# Patient Record
Sex: Male | Born: 1970 | Race: Black or African American | Hispanic: No | Marital: Single
Health system: Southern US, Community
[De-identification: ages and names within clinical notes are randomized; demographics above are authoritative.]

---

## 2006-02-13 ENCOUNTER — Emergency Department (HOSPITAL_COMMUNITY): Admission: EM | Admit: 2006-02-13 | Discharge: 2006-02-13 | Payer: Self-pay | Admitting: Emergency Medicine

## 2006-08-25 ENCOUNTER — Emergency Department (HOSPITAL_COMMUNITY): Admission: EM | Admit: 2006-08-25 | Discharge: 2006-08-26 | Payer: Self-pay | Admitting: *Deleted

## 2006-10-18 ENCOUNTER — Emergency Department (HOSPITAL_COMMUNITY): Admission: EM | Admit: 2006-10-18 | Discharge: 2006-10-18 | Payer: Self-pay | Admitting: Emergency Medicine

## 2008-11-15 ENCOUNTER — Inpatient Hospital Stay (HOSPITAL_COMMUNITY): Admission: RE | Admit: 2008-11-15 | Discharge: 2008-11-18 | Payer: Self-pay | Admitting: Psychiatry

## 2008-11-15 ENCOUNTER — Ambulatory Visit: Payer: Self-pay | Admitting: Psychiatry

## 2008-11-15 ENCOUNTER — Emergency Department (HOSPITAL_COMMUNITY): Admission: EM | Admit: 2008-11-15 | Discharge: 2008-11-15 | Payer: Self-pay | Admitting: Emergency Medicine

## 2010-04-13 LAB — DIFFERENTIAL
Eosinophils Absolute: 0.3 10*3/uL (ref 0.0–0.7)
Eosinophils Relative: 3 % (ref 0–5)
Lymphocytes Relative: 35 % (ref 12–46)
Lymphs Abs: 3.3 10*3/uL (ref 0.7–4.0)
Monocytes Absolute: 0.5 10*3/uL (ref 0.1–1.0)

## 2010-04-13 LAB — ETHANOL: Alcohol, Ethyl (B): 177 mg/dL — ABNORMAL HIGH (ref 0–10)

## 2010-04-13 LAB — HEPATIC FUNCTION PANEL
Albumin: 3.9 g/dL (ref 3.5–5.2)
Indirect Bilirubin: 0.4 mg/dL (ref 0.3–0.9)
Total Protein: 6.8 g/dL (ref 6.0–8.3)

## 2010-04-13 LAB — COMPREHENSIVE METABOLIC PANEL
ALT: 22 U/L (ref 0–53)
AST: 32 U/L (ref 0–37)
Albumin: 4 g/dL (ref 3.5–5.2)
CO2: 28 mEq/L (ref 19–32)
Chloride: 110 mEq/L (ref 96–112)
Creatinine, Ser: 1.07 mg/dL (ref 0.4–1.5)
GFR calc Af Amer: >60 mL/min (ref >60)
Sodium: 146 mEq/L — ABNORMAL HIGH (ref 135–145)
Total Bilirubin: 0.4 mg/dL (ref 0.3–1.2)

## 2010-04-13 LAB — RAPID URINE DRUG SCREEN, HOSP PERFORMED
Amphetamines: NOT DETECTED
Benzodiazepines: POSITIVE — AB

## 2010-04-13 LAB — CBC
MCV: 92.1 fL (ref 78.0–100.0)
Platelets: 197 10*3/uL (ref 150–400)
RBC: 4.39 MIL/uL (ref 4.22–5.81)
WBC: 9.4 10*3/uL (ref 4.0–10.5)

## 2010-04-13 LAB — TSH: TSH: 0.309 u[IU]/mL — ABNORMAL LOW (ref 0.350–4.500)

## 2010-10-21 LAB — I-STAT 8, (EC8 V) (CONVERTED LAB)
BUN: 7
Bicarbonate: 23.4
HCT: 50
Operator id: 151321
pCO2, Ven: 47.4

## 2010-10-21 LAB — DIFFERENTIAL
Basophils Absolute: 0.1
Lymphocytes Relative: 40
Lymphs Abs: 3.8 — ABNORMAL HIGH
Monocytes Absolute: 0.7
Monocytes Relative: 7
Neutro Abs: 4.7

## 2010-10-21 LAB — ETHANOL: Alcohol, Ethyl (B): 297 — ABNORMAL HIGH

## 2010-10-21 LAB — RAPID URINE DRUG SCREEN, HOSP PERFORMED
Amphetamines: NOT DETECTED
Benzodiazepines: NOT DETECTED
Cocaine: NOT DETECTED
Tetrahydrocannabinol: POSITIVE — AB

## 2010-10-21 LAB — CBC
Hemoglobin: 15.1
RBC: 5.03
WBC: 9.5

## 2010-10-21 LAB — POCT I-STAT CREATININE: Creatinine, Ser: 1.1

## 2010-11-21 IMAGING — CR DG KNEE COMPLETE 4+V*L*
4 series · 4 of 4 positions shown · non-contrast
Comparison: None.

CLINICAL DATA: Anterior knee pain, twisting injury

LEFT KNEE - COMPLETE 4+ VIEW

[t knee ap left]
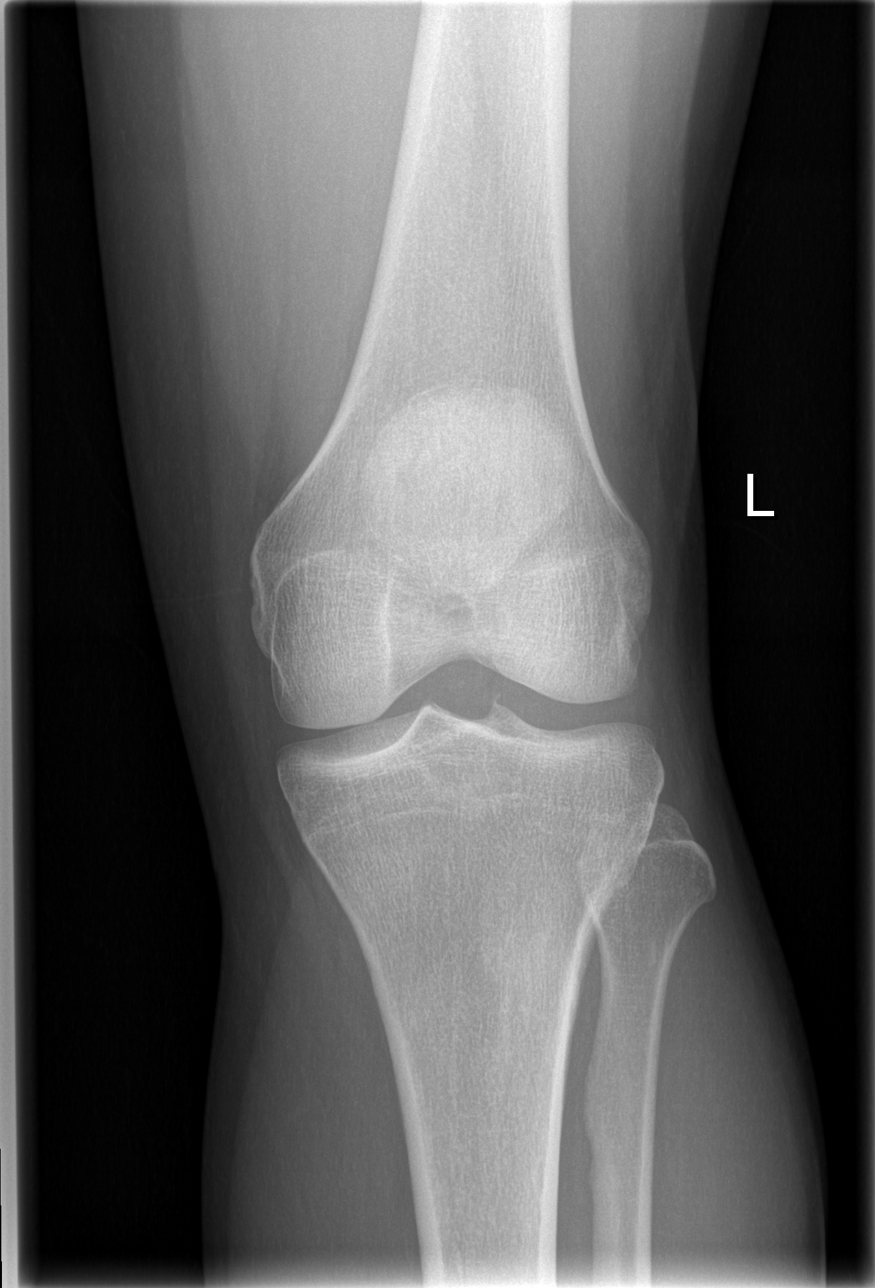

[t knee oblique left (1 of 2)]
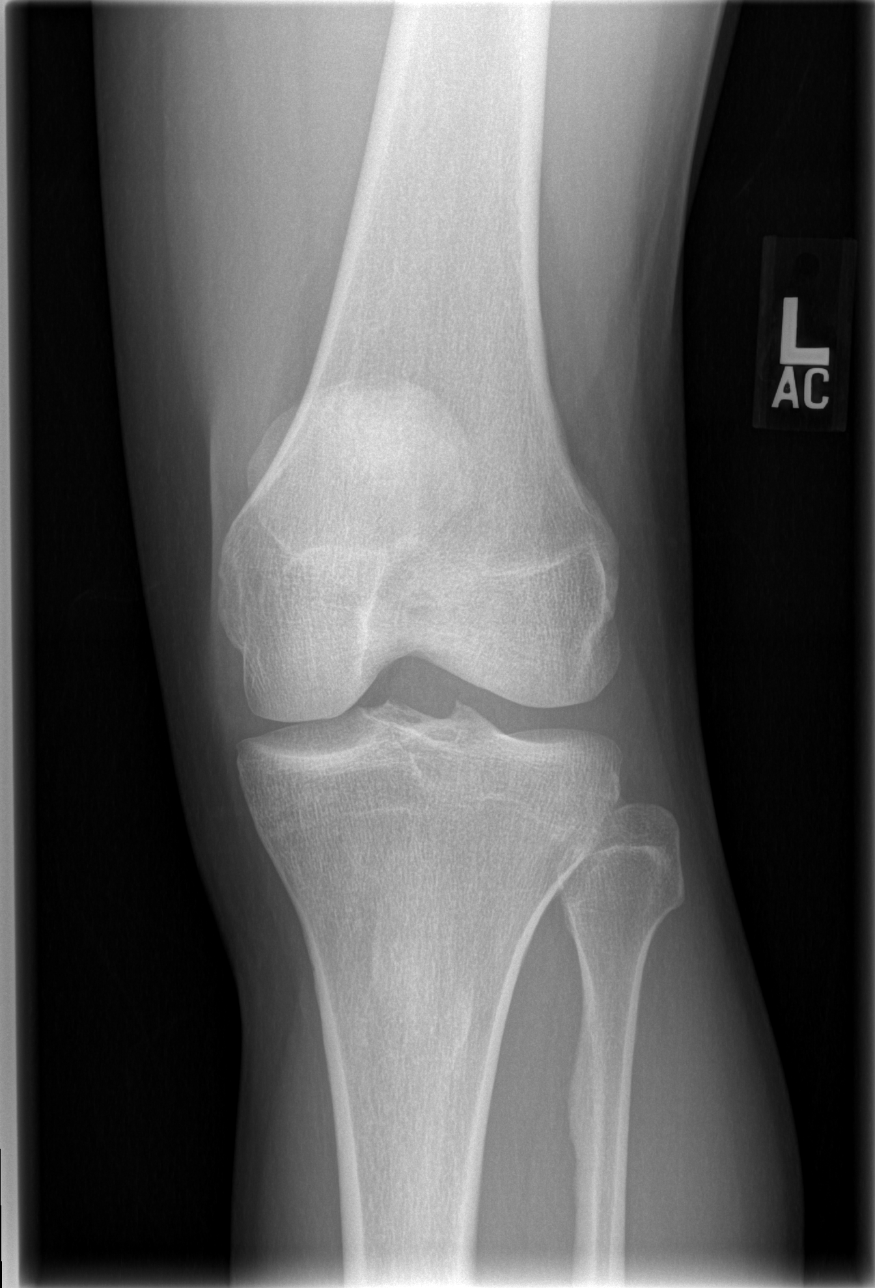

[t knee oblique left (2 of 2)]
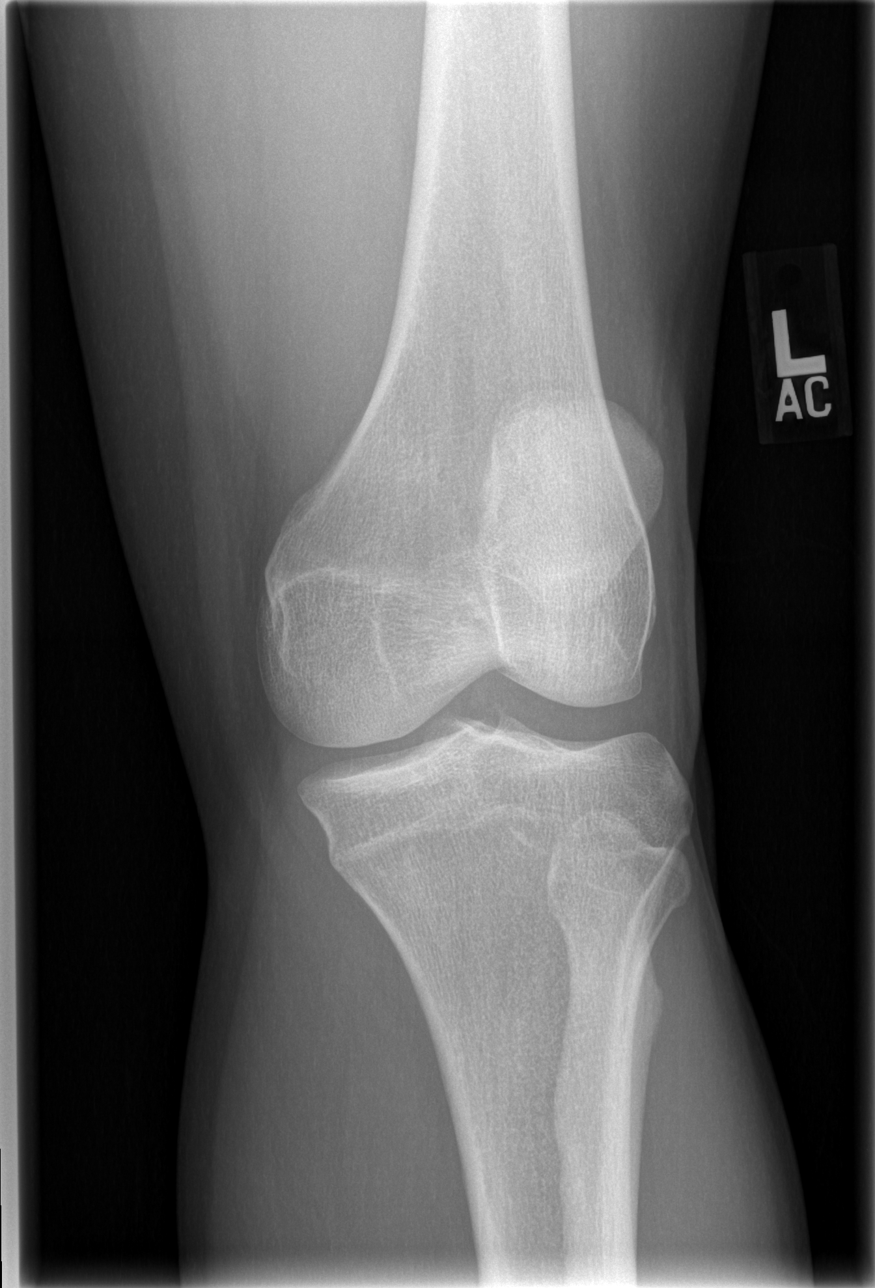

[t knee lat left]
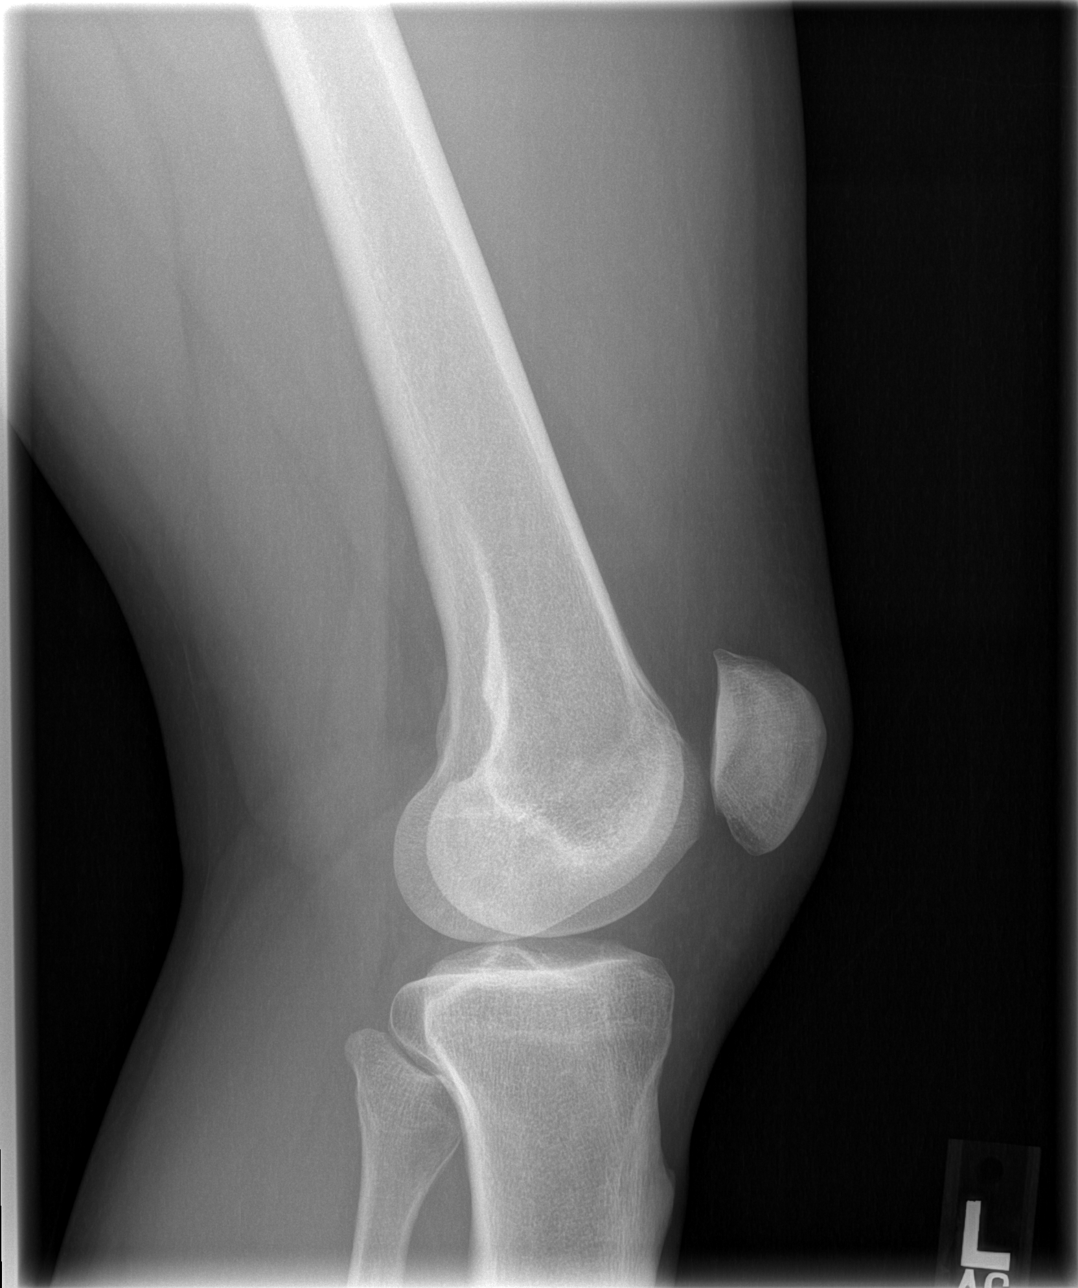

[4 of 4 positions shown; findings below may reference images not displayed]

FINDINGS: Normal alignment.  No fracture or effusion. Preserved
joint spaces.
IMPRESSION: No acute finding.

## 2015-02-01 ENCOUNTER — Encounter (HOSPITAL_COMMUNITY): Payer: Self-pay | Admitting: *Deleted

## 2015-02-01 ENCOUNTER — Emergency Department (HOSPITAL_COMMUNITY)
Admission: EM | Admit: 2015-02-01 | Discharge: 2015-02-02 | Disposition: A | Discharge status: Home / Self Care | Payer: Self-pay | Attending: Emergency Medicine | Admitting: Emergency Medicine

## 2015-02-01 DIAGNOSIS — S61511A Laceration without foreign body of right wrist, initial encounter: Secondary | ICD-10-CM | POA: Insufficient documentation

## 2015-02-01 DIAGNOSIS — T148XXA Other injury of unspecified body region, initial encounter: Secondary | ICD-10-CM

## 2015-02-01 DIAGNOSIS — Y9289 Other specified places as the place of occurrence of the external cause: Secondary | ICD-10-CM | POA: Insufficient documentation

## 2015-02-01 DIAGNOSIS — S21112A Laceration without foreign body of left front wall of thorax without penetration into thoracic cavity, initial encounter: Secondary | ICD-10-CM | POA: Insufficient documentation

## 2015-02-01 DIAGNOSIS — S51811A Laceration without foreign body of right forearm, initial encounter: Secondary | ICD-10-CM | POA: Insufficient documentation

## 2015-02-01 DIAGNOSIS — Y998 Other external cause status: Secondary | ICD-10-CM | POA: Insufficient documentation

## 2015-02-01 DIAGNOSIS — S61412A Laceration without foreign body of left hand, initial encounter: Secondary | ICD-10-CM | POA: Insufficient documentation

## 2015-02-01 DIAGNOSIS — Z23 Encounter for immunization: Secondary | ICD-10-CM | POA: Insufficient documentation

## 2015-02-01 DIAGNOSIS — S40812A Abrasion of left upper arm, initial encounter: Secondary | ICD-10-CM | POA: Insufficient documentation

## 2015-02-01 DIAGNOSIS — S40811A Abrasion of right upper arm, initial encounter: Secondary | ICD-10-CM | POA: Insufficient documentation

## 2015-02-01 DIAGNOSIS — W540XXA Bitten by dog, initial encounter: Secondary | ICD-10-CM | POA: Insufficient documentation

## 2015-02-01 DIAGNOSIS — S20319A Abrasion of unspecified front wall of thorax, initial encounter: Secondary | ICD-10-CM | POA: Insufficient documentation

## 2015-02-01 DIAGNOSIS — Y9301 Activity, walking, marching and hiking: Secondary | ICD-10-CM | POA: Insufficient documentation

## 2015-02-01 DIAGNOSIS — S61451A Open bite of right hand, initial encounter: Secondary | ICD-10-CM | POA: Insufficient documentation

## 2015-02-01 MED ORDER — RABIES VACCINE, PCEC IM SUSR
1.0000 mL | Freq: Once | INTRAMUSCULAR | Status: AC
  Administered 2015-02-01: 1 mL via INTRAMUSCULAR
  Filled 2015-02-01: qty 1

## 2015-02-01 MED ORDER — RABIES IMMUNE GLOBULIN 150 UNIT/ML IM INJ
1800.0000 [IU] | INJECTION | Freq: Once | INTRAMUSCULAR | Status: AC
  Administered 2015-02-01: 1800 [IU] via INTRAMUSCULAR
  Filled 2015-02-01: qty 12

## 2015-02-01 NOTE — ED Notes (Signed)
Pt st's he was bitten by an unknown dog.  Pt has multiple dog bites to bil hands, arm and abrasions to bil lower legs and a small abrasion to right forehead.

## 2015-02-01 NOTE — ED Notes (Addendum)
Pt c/o dog bites to his bilateral arms, hands, and legs. Pt has abrasion to right forehead. Pt does not know the dog, believes it was a pitbull mix. Last tetanus shot 2010

## 2015-02-01 NOTE — ED Notes (Signed)
[facility] [employer] made aware.

## 2015-02-01 NOTE — ED Provider Notes (Signed)
CSN: [id number]     Arrival date & time 02/01/15  2149 History  By signing my name below, I, Gonzella Lex, attest that this documentation has been prepared under the direction and in the presence of [facility], PA-C. Electronically Signed: Gonzella Lex, Scribe. 02/01/2015. 10:58 PM.    Chief Complaint  Patient presents with  . Animal Bite   The history is provided by the patient. No language interpreter was used.    HPI Comments: Robert Thompson is a 45 y.o. male who presents to the Emergency Department complaining of multiple dog bites to his arms, hands, and chest about one hour ago, which occurred while he was going on a power walk around his neighborhood. He suspects that the dog was a pitbull mix but is unsure of the dog's owner or rabies vaccine status. Tetanus is up to date.     History reviewed. No pertinent past medical history. History reviewed. No pertinent past surgical history. History reviewed. No pertinent family history. Social History  Substance Use Topics  . Smoking status: Unknown If Ever Smoked  . Smokeless tobacco: Never Used  . Alcohol Use: Yes    Review of Systems  Constitutional: Negative for fever.  HENT: Negative for congestion.   Eyes: Negative for visual disturbance.  Respiratory: Negative for shortness of breath and stridor.   Cardiovascular: Negative for chest pain.  Gastrointestinal: Negative for abdominal pain.  Musculoskeletal: Negative for joint swelling.  Skin: Positive for wound.  Allergic/Immunologic: Negative for immunocompromised state.  Hematological: Does not bruise/bleed easily.   Allergies  Review of patient's allergies indicates no known allergies.  Home Medications   Prior to Admission medications   Medication Sig Start Date End Date Taking? Authorizing Provider  amoxicillin-clavulanate (AUGMENTIN) 875-125 MG tablet Take 1 tablet by mouth every 12 (twelve) hours. 02/02/15   Chase Picket Delorus Langwell, PA-C   oxyCODONE-acetaminophen (PERCOCET/ROXICET) 5-325 MG tablet Take 1 tablet by mouth every 6 (six) hours as needed for severe pain. 02/02/15   Kamyla Olejnik Pilcher Sahaj Bona, PA-C   BP 99/56 mmHg  Pulse 76  Temp(Src) 98 F (36.7 C) (Oral)  Resp 16  Wt 81.336 kg  SpO2 96% Physical Exam  Constitutional: He is oriented to person, place, and time. He appears well-developed and well-nourished. No distress.  HENT:  Head: Normocephalic and atraumatic.  Neck: Normal range of motion. Neck supple. No tracheal deviation present.  Cardiovascular: Normal rate, regular rhythm, normal heart sounds and intact distal pulses.   Pulmonary/Chest: Effort normal and breath sounds normal. No respiratory distress. He has no wheezes. He has no rales.  Abdominal: Soft. He exhibits no distension. There is no tenderness.  Musculoskeletal:  5/5 muscle strength bilateral upper extremities Full motor of bilateral hands intact.   Neurological: He is alert and oriented to person, place, and time.  Bilateral upper extremities neurovascularly intact.   Skin: Skin is warm and dry. No rash noted.  3 cm laceration on the right wrist 3.5 cm laceration on right forearm 4 cm left, chest laceration 2 cm left dorsal hand laceration 2 cm laceration over left thenar eminence  Multiple skin abrasions of chest and arms  Psychiatric: He has a normal mood and affect.  Nursing note and vitals reviewed.   ED Course  Procedures   LACERATION REPAIR Performed by: Chase Picket Brexley Cutshaw Authorized by: Chase Picket Etna Forquer Consent: Verbal consent obtained. Risks and benefits: risks, benefits and alternatives were discussed Consent given by: patient Patient identity confirmed: provided demographic data Prepped  and Draped in normal sterile fashion Wound explored Laceration Location and length: 3 cm right wrist, 3.5 cm right forearm, 2 cm dorsal hand, 2 cm thenar eminence  No Foreign Bodies seen or palpated Irrigation method: syringe Amount of  cleaning: standard Skin closure: 4-0 Number of sutures: One suture placed in enter of each laceration Technique: Simplet interrupted Patient tolerance: Patient tolerated the procedure well with no immediate complications.  DIAGNOSTIC STUDIES:    Oxygen Saturation is 99% on RA, normal by my interpretation.   COORDINATION OF CARE:  10:45 PM Will order rabies vaccinations. Discussed treatment plan with pt at bedside and pt agreed to plan.   MDM   Final diagnoses:  Animal bites    Robert Thompson presents with multiple abrasions and lacerations after being attacked by unprovoked dog. Does not know dog owner or dog rabies status. Will give rabies IG and vaccine. All lacerations and abrasions thoroughly cleaned in ED. Four of the areas needed some skin closure - one suture was placed in center of these lacerations. Augmentin for home. Home care instructions, return precautions, and follow up instructions given.  Laceration above thenar eminence appears to have no muscle, tendon, or nerve involvement. Will give referral for hand surgery to follow up as needed.   I personally performed the services described in this documentation, which was scribed in my presence. The recorded information has been reviewed and is accurate.   [facility] Valicia Rief, PA-C 02/02/15 1610  Vanetta Mulders, MD 02/02/15 [id number]

## 2015-02-02 MED ORDER — OXYCODONE-ACETAMINOPHEN 5-325 MG PO TABS
1.0000 | ORAL_TABLET | Freq: Once | ORAL | Status: AC
  Administered 2015-02-02: 1 via ORAL
  Filled 2015-02-02: qty 1

## 2015-02-02 MED ORDER — AMOXICILLIN-POT CLAVULANATE 875-125 MG PO TABS: 1.0000 | ORAL_TABLET | Freq: Two times a day (BID) | ORAL | Status: AC

## 2015-02-02 MED ORDER — OXYCODONE-ACETAMINOPHEN 5-325 MG PO TABS: 1.0000 | ORAL_TABLET | Freq: Four times a day (QID) | ORAL | Status: AC | PRN

## 2015-02-02 NOTE — Discharge Instructions (Signed)
1. Medications: Augmentin - Please take all of your antibiotics until finished!, Percocet for pain - This can make you very drowsy - please do not drink or drive on this medication, continue usual home medications 2. Treatment: rest, drink plenty of fluids, keep dog bite areas clean and dry.  3. Follow Up: Please follow up with your primary doctor, urgent care, or return to ED in 7 days for wound check and suture removal. Follow up as needed with hand surgery (contact info listed).  Please return to the ER for increased redness or swelling, new or worsening symptoms, any additional concerns.

## 2023-10-18 ENCOUNTER — Encounter (HOSPITAL_COMMUNITY): Payer: Self-pay

## 2023-10-18 ENCOUNTER — Other Ambulatory Visit: Payer: Self-pay

## 2023-10-18 ENCOUNTER — Emergency Department (HOSPITAL_COMMUNITY): Admission: EM | Admit: 2023-10-18 | Discharge: 2023-10-18 | Disposition: A | Discharge status: Home / Self Care

## 2023-10-18 DIAGNOSIS — M25512 Pain in left shoulder: Secondary | ICD-10-CM | POA: Diagnosis not present

## 2023-10-18 DIAGNOSIS — M542 Cervicalgia: Secondary | ICD-10-CM | POA: Diagnosis present

## 2023-10-18 MED ORDER — METHOCARBAMOL 500 MG PO TABS: 500.0000 mg | ORAL_TABLET | Freq: Two times a day (BID) | ORAL | 0 refills | Status: DC

## 2023-10-18 MED ORDER — KETOROLAC TROMETHAMINE 15 MG/ML IJ SOLN
15.0000 mg | Freq: Once | INTRAMUSCULAR | Status: AC
  Administered 2023-10-18: 15 mg via INTRAMUSCULAR
  Filled 2023-10-18: qty 1

## 2023-10-18 NOTE — Discharge Instructions (Signed)
 You were evaluated in the emergency room for left shoulder and neck pain.  Your exam is consistent with a muscle strain versus tendinitis versus nerve compression in your neck.  Regardless a prescription for Robaxin, muscle relaxer was sent into your pharmacy.  Please avoid driving or drinking alcohol while using this medication as it may cause drowsiness.  You may additionally alternate Tylenol  and ibuprofen.  Please call the number on the sheet to follow-up with orthopedic doctor.

## 2023-10-18 NOTE — ED Provider Notes (Signed)
 [place] EMERGENCY DEPARTMENT AT [facility] Provider Note   CSN: [id number] Arrival date & time: 10/18/23  1026     Patient presents with: Neck Pain   Robert Thompson is a 53 y.o. male otherwise healthy presents with complaints of left shoulder and neck pain x 2 weeks.  Denies any injury or trauma.  Pain is worse with movement.  Asked to describe some numbness and tingling that radiates into his left shoulder that is worse with cervical range of motion.  No IV drug use.  No history of malignancy.  No headaches or vision changes reported.    Neck Pain    History reviewed. No pertinent past medical history. History reviewed. No pertinent surgical history.   Prior to Admission medications   Medication Sig Start Date End Date Taking? Authorizing Provider  methocarbamol (ROBAXIN) 500 MG tablet Take 1 tablet (500 mg total) by mouth 2 (two) times daily. 10/18/23  Yes Donnajean Lynwood DEL, PA-C  amoxicillin -clavulanate (AUGMENTIN ) 875-125 MG tablet Take 1 tablet by mouth every 12 (twelve) hours. 02/02/15   Ward, Jaime Pilcher, PA-C  oxyCODONE -acetaminophen  (PERCOCET/ROXICET) 5-325 MG tablet Take 1 tablet by mouth every 6 (six) hours as needed for severe pain. 02/02/15   Ward, Ami Copes, PA-C    Allergies: Patient has no known allergies.    Review of Systems  Musculoskeletal:  Positive for neck pain.    Updated Vital Signs BP (!) 148/99 (BP Location: Right Arm)   Pulse 78   Temp 98.2 F (36.8 C) (Oral)   Resp 16   Ht 5' 7 (1.702 m)   Wt 79.4 kg   SpO2 98%   BMI 27.41 kg/m   Physical Exam Vitals and nursing note reviewed.  Constitutional:      General: He is not in acute distress.    Appearance: He is well-developed.  HENT:     Head: Normocephalic and atraumatic.  Eyes:     Conjunctiva/sclera: Conjunctivae normal.  Cardiovascular:     Rate and Rhythm: Normal rate and regular rhythm.     Heart sounds: No murmur heard. Pulmonary:     Effort: Pulmonary  effort is normal. No respiratory distress.     Breath sounds: Normal breath sounds.  Abdominal:     Palpations: Abdomen is soft.     Tenderness: There is no abdominal tenderness.  Musculoskeletal:        General: No swelling.     Cervical back: Neck supple.     Comments: Tenderness to left upper trap and generalized tenderness to left shoulder.  Positive Spurling's, discomfort with left shoulder range of motion, positive Hawkins, positive Neer's.  5 out of 5 upper extremity bilateral, pulses intact, no overlying erythema, warmth or swelling  Skin:    General: Skin is warm and dry.     Capillary Refill: Capillary refill takes less than 2 seconds.  Neurological:     General: No focal deficit present.     Mental Status: He is alert.  Psychiatric:        Mood and Affect: Mood normal.     (all labs ordered are listed, but only abnormal results are displayed) Labs Reviewed - No data to display  EKG: None  Radiology: No results found.   Procedures   Medications Ordered in the ED  ketorolac (TORADOL) 15 MG/ML injection 15 mg (15 mg Intramuscular Given 10/18/23 1110)    Clinical Course as of 10/18/23 1118  Thu Oct 18, 2023  1106 Otherwise healthy  patient evaluated for atraumatic left shoulder and neck pain and numbness and tingling x 2 weeks.  No associated concerning neurological symptoms.  He he has tenderness to his left upper trap and generalized tenderness to his left shoulder, has a positive Spurling's.  Overall most consistent with musculoskeletal etiology such as muscle strain/tendinitis/cervical stenosis.  Regardless will send in prescription for Robaxin, encouraged alternate Tylenol  and ibuprofen and will provide Ortho follow-up.  Will provide a shot of Toradol here in the ED.  Do not feel that any advanced imaging is indicated today.  Patient is understanding agreement plan. [JT]    Clinical Course User Index [JT] Donnajean Lynwood DEL, PA-C                                  Medical Decision Making  This patient presents to the ED with chief complaint(s) of shoulder pain.  The complaint involves an extensive differential diagnosis and also carries with it a high risk of complications and morbidity.   Pertinent past medical history as listed in HPI  The differential diagnosis includes  Exam and history not consistent with CVA or TIA.  He has no meningismus, no headache and is afebrile.  No neurological symptoms to suggest vertebral artery dissection.  Do not suspect septic joint.  Additional history obtained: Records reviewed Care Everywhere/External Records  Disposition:   Patient will be discharged home. The patient has been appropriately medically screened and/or stabilized in the ED. I have low suspicion for any other emergent medical condition which would require further screening, evaluation or treatment in the ED or require inpatient management. At time of discharge the patient is hemodynamically stable and in no acute distress. I have discussed work-up results and diagnosis with patient and answered all questions. Patient is agreeable with discharge plan. We discussed strict return precautions for returning to the emergency department and they verbalized understanding.     Social Determinants of Health:   none  This note was dictated with voice recognition software.  Despite best efforts at proofreading, errors may have occurred which can change the documentation meaning.       Final diagnoses:  Acute pain of left shoulder  Neck pain    ED Discharge Orders          Ordered    methocarbamol (ROBAXIN) 500 MG tablet  2 times daily        10/18/23 1116               Donnajean Lynwood DEL, PA-C 10/18/23 1118    Neysa Caron PARAS, DO 10/18/23 1548

## 2023-10-18 NOTE — ED Triage Notes (Signed)
 Pt has sharp left sided neck pain that goes into his left shoulder and arm x 2 weeks.

## 2024-01-31 ENCOUNTER — Other Ambulatory Visit: Payer: Self-pay

## 2024-01-31 ENCOUNTER — Emergency Department (HOSPITAL_COMMUNITY)
Admission: EM | Admit: 2024-01-31 | Discharge: 2024-01-31 | Disposition: A | Discharge status: Home / Self Care | Attending: Emergency Medicine | Admitting: Emergency Medicine

## 2024-01-31 ENCOUNTER — Encounter (HOSPITAL_COMMUNITY): Payer: Self-pay

## 2024-01-31 ENCOUNTER — Emergency Department (HOSPITAL_COMMUNITY)

## 2024-01-31 DIAGNOSIS — R10A1 Flank pain, right side: Secondary | ICD-10-CM | POA: Diagnosis present

## 2024-01-31 DIAGNOSIS — M545 Low back pain, unspecified: Secondary | ICD-10-CM | POA: Insufficient documentation

## 2024-01-31 DIAGNOSIS — R739 Hyperglycemia, unspecified: Secondary | ICD-10-CM | POA: Diagnosis not present

## 2024-01-31 DIAGNOSIS — I2699 Other pulmonary embolism without acute cor pulmonale: Secondary | ICD-10-CM

## 2024-01-31 LAB — URINALYSIS, ROUTINE W REFLEX MICROSCOPIC
Bilirubin Urine: NEGATIVE
Glucose, UA: NEGATIVE mg/dL
Ketones, ur: NEGATIVE mg/dL
Nitrite: NEGATIVE
Protein, ur: NEGATIVE mg/dL
Specific Gravity, Urine: 1.018 (ref 1.005–1.030)
pH: 5 (ref 5.0–8.0)

## 2024-01-31 LAB — CBC
HCT: 44.8 % (ref 39.0–52.0)
Hemoglobin: 15.4 g/dL (ref 13.0–17.0)
MCH: 29.6 pg (ref 26.0–34.0)
MCHC: 34.4 g/dL (ref 30.0–36.0)
MCV: 86 fL (ref 80.0–100.0)
Platelets: 228 K/uL (ref 150–400)
RBC: 5.21 MIL/uL (ref 4.22–5.81)
RDW: 14.6 % (ref 11.5–15.5)
WBC: 9.7 K/uL (ref 4.0–10.5)
nRBC: 0 % (ref 0.0–0.2)

## 2024-01-31 LAB — BASIC METABOLIC PANEL WITH GFR
Anion gap: 14 (ref 5–15)
BUN: 10 mg/dL (ref 6–20)
CO2: 19 mmol/L — ABNORMAL LOW (ref 22–32)
Calcium: 8.7 mg/dL — ABNORMAL LOW (ref 8.9–10.3)
Chloride: 102 mmol/L (ref 98–111)
Creatinine, Ser: 1.13 mg/dL (ref 0.61–1.24)
GFR, Estimated: >60 mL/min
Glucose, Bld: 169 mg/dL — ABNORMAL HIGH (ref 70–99)
Potassium: 4.3 mmol/L (ref 3.5–5.1)
Sodium: 135 mmol/L (ref 135–145)

## 2024-01-31 LAB — D-DIMER, QUANTITATIVE: D-Dimer, Quant: 1.89 ug{FEU}/mL — ABNORMAL HIGH (ref 0.00–0.50)

## 2024-01-31 MED ORDER — OXYCODONE HCL 5 MG PO TABS
2.5000 mg | ORAL_TABLET | Freq: Once | ORAL | Status: AC
  Administered 2024-01-31: 2.5 mg via ORAL
  Filled 2024-01-31: qty 1

## 2024-01-31 MED ORDER — OXYCODONE HCL 5 MG PO TABS: 5.0000 mg | ORAL_TABLET | ORAL | 0 refills | Status: DC | PRN

## 2024-01-31 MED ORDER — APIXABAN (ELIQUIS) VTE STARTER PACK (10MG AND 5MG): ORAL_TABLET | ORAL | 0 refills | Status: DC

## 2024-01-31 MED ORDER — METHOCARBAMOL 750 MG PO TABS: 750.0000 mg | ORAL_TABLET | Freq: Three times a day (TID) | ORAL | 0 refills | Status: AC | PRN

## 2024-01-31 MED ORDER — KETOROLAC TROMETHAMINE 15 MG/ML IJ SOLN
15.0000 mg | Freq: Once | INTRAMUSCULAR | Status: AC
  Administered 2024-01-31: 15 mg via INTRAVENOUS
  Filled 2024-01-31: qty 1

## 2024-01-31 MED ORDER — OXYCODONE HCL 5 MG PO TABS: 5.0000 mg | ORAL_TABLET | Freq: Four times a day (QID) | ORAL | 0 refills | Status: AC | PRN

## 2024-01-31 MED ORDER — IOHEXOL 350 MG/ML SOLN
65.0000 mL | Freq: Once | INTRAVENOUS | Status: AC | PRN
  Administered 2024-01-31: 65 mL via INTRAVENOUS

## 2024-01-31 NOTE — ED Provider Notes (Signed)
 " [place] EMERGENCY DEPARTMENT AT [facility] Provider Assume Care Note I assumed care of Robert Thompson on 01/31/2024 at 5 PM from Dr. Bernard.   Briefly, Robert Thompson is a 54 y.o. male who: PMHx: None pertinent P/w right flank/rib pain with a pleuritic component   Plan at the time of handoff: Chest x-ray, initial labs and CT stone protocol negative, however still having pain Follow-up D-dimer to determine need for CT PE study   Please refer to the original providers note for additional information regarding the care of Robert Thompson.  Reassessment: I personally reassessed the patient: Patient complained of persistent chest pain  Vital Signs:  ED Triage Vitals  Encounter Vitals Group     BP 01/31/24 1032 136/80     Girls Systolic BP Percentile --      Girls Diastolic BP Percentile --      Boys Systolic BP Percentile --      Boys Diastolic BP Percentile --      Pulse Rate 01/31/24 1032 95     Resp 01/31/24 1032 20     Temp 01/31/24 1032 99.1 F (37.3 C)     Temp Source 01/31/24 1032 Oral     SpO2 01/31/24 1032 98 %     Weight 01/31/24 1031 175 lb (79.4 kg)     Height 01/31/24 1031 5' 6 (1.676 m)     Head Circumference --      Peak Flow --      Pain Score 01/31/24 1031 10     Pain Loc --      Pain Education --      Exclude from Growth Chart --      Hemodynamics:  The patient is hemodynamically stable. Mental Status:  The patient is alert  CT Renal Stone Study Result Date: 01/31/2024 EXAM: CT ABDOMEN AND PELVIS WITHOUT CONTRAST 01/31/2024 04:26:00 PM TECHNIQUE: CT of the abdomen and pelvis was performed without the administration of intravenous contrast. Multiplanar reformatted images are provided for review. Automated exposure control, iterative reconstruction, and/or weight-based adjustment of the mA/kV was utilized to reduce the radiation dose to as low as reasonably achievable. COMPARISON: None available. CLINICAL HISTORY: Abdominal/flank  pain, stone suspected; right flank pain. FINDINGS: LOWER CHEST: Posterior bibasilar dependent atelectasis. LIVER: The liver is unremarkable. GALLBLADDER AND BILE DUCTS: Decompressed gallbladder. No radiopaque stones or wall thickening. No biliary ductal dilatation. SPLEEN: No acute abnormality. PANCREAS: No acute abnormality. ADRENAL GLANDS: No acute abnormality. KIDNEYS, URETERS AND BLADDER: No stones in the kidneys or ureters. No hydronephrosis. No perinephric or periureteral stranding. The urinary bladder is almost completely decompressed. GI AND BOWEL: Stomach demonstrates no acute abnormality. There is no bowel obstruction. Normal appendix. Scattered colonic diverticulosis. No changes of acute diverticulitis. PERITONEUM AND RETROPERITONEUM: No ascites. No free air. VASCULATURE: Aorta is normal in caliber. Fairly diffuse aortic atherosclerosis. LYMPH NODES: No lymphadenopathy. REPRODUCTIVE ORGANS: No acute abnormality. No prostatomegaly. BONES AND SOFT TISSUES: No acute osseous abnormality. Mild to moderate degenerative disc disease at L4-L5. No focal soft tissue abnormality. IMPRESSION: 1. No acute findings in the abdomen or pelvis. 2. Scattered colonic diverticulosis. No changes of acute diverticulitis. Electronically signed by: Rogelia Myers MD 01/31/2024 04:39 PM EST RP Workstation: [id number]   DG Chest 2 View Result Date: 01/31/2024 CLINICAL DATA:  Right-sided flank pain/rib pain 2 days. Pleuritic pain. EXAM: CHEST - 2 VIEW COMPARISON:  08/26/2006 FINDINGS: Lungs are adequately inflated without focal airspace consolidation or effusion. No pneumothorax. Cardiomediastinal silhouette  is normal. No evidence of rib fracture. Remaining bones and soft tissues are unremarkable. IMPRESSION: No active cardiopulmonary disease. Electronically Signed   By: Toribio Agreste M.D.   On: 01/31/2024 11:06     Additional MDM: D-dimer was obtained to further risk ratified for need for CT PE study, unfortunately this was  elevated for patient's age-adjusted norm, therefore CT PE study was indicated and was obtained.  Against PE study, do not appreciate large central PE, I do appreciate possible slight airspace abnormality in the right lower lung, there is questionable intraluminal opacification in the areas near the slight airspace opacification in the lower lungs.  I did receive a call from [facility] radiology noting that patient did have distal PEs without evidence of heart strain.  This is consistent with patient's pleuritic pain.  Patient ordered Eliquis  starter pack, as well as oxycodone  for breakthrough pain.  Patient does not have established primary care follow-up, however does have insurance, understands importance of need for follow-up, Eliquis  and short course of oxycodone  for breakthrough pain sent to preferred pharmacy, strict return precautions discussed, patient discharged in stable condition.  Disposition: DISCHARGE: I believe that the patient is safe for discharge home with outpatient follow-up. Patient was informed of all pertinent physical exam, laboratory, and imaging findings. Patient's suspected etiology of their symptom presentation was discussed with the patient and all questions were answered. We discussed following up with PCP. I provided thorough ED return precautions. The patient feels safe and comfortable with this plan.   FREDRIK CANDIE Later, MD Emergency Medicine    Later Jerilynn RAMAN, MD 01/31/24 2237  "

## 2024-01-31 NOTE — ED Notes (Addendum)
 Patient transported to CT

## 2024-01-31 NOTE — Discharge Instructions (Addendum)
 Robert Thompson  Thank you for allowing us  to take care of you today.  You came to the Emergency Department today because you are having pain in your right side back, particularly with breathing.  Here in the emergency department we got a CT of your belly and kidneys which did not show any abnormalities.  We got a CT of your lungs which did show that you had some small blood clots in your lung.  We are starting you on a blood thinner called Eliquis  (apixaban ) which will help keep your blood thin while your body breaks down the blood clot.  You will likely need to be on this medication for at least 3 to 6 months.  You do need tablet care with a primary care doctor to determine how long you need to be on the Eliquis  and to give you refills.  You should take Tylenol  as needed for pain control.  Will prescribe you some oxycodone  over the next several days which you can use for severe pain, however please try to minimize the amount that you are taking this pain medication.   To-Do: 1. Please follow-up with your primary doctor within 1 - 2 weeks / as soon as possible.   Please return to the Emergency Department or call 911 if you experience have worsening of your symptoms, or do not get better, chest pain, shortness of breath, severe or significantly worsening pain, high fever, severe confusion, pass out or have any reason to think that you need emergency medical care.   We hope you feel better soon.   Mitzie Later, MD Department of Emergency Medicine [facility]

## 2024-01-31 NOTE — ED Triage Notes (Signed)
 C/O right sided flank pain and rib cage pain since Tuesday. Denies CP and SHOB. Axox4. Pt states hurt worse when taking a deep breath. Denies nausea/vomiting/urinary issues.

## 2024-01-31 NOTE — ED Provider Triage Note (Signed)
 Emergency Medicine Provider Triage Evaluation Note  Robert Thompson , a 54 y.o. male  was evaluated in triage.  Pt complains of pain to right flank in past couple days. Denies injury or strain. At times worse w certain movements and/or deep breaths.   Review of Systems  Positive: Flank pain Negative: Fever. No hx kidney stones. No trauma/strain. No nv. No sob.  Physical Exam  BP 136/80 (BP Location: Right Arm)   Pulse 95   Temp 99.1 F (37.3 C) (Oral)   Resp 20   Ht 1.676 m (5' 6)   Wt 79.4 kg   SpO2 98%   BMI 28.25 kg/m  Gen:   Awake, no distress   Resp:  Normal effort breathing comfortably. CV:  RRR Abd:   Soft non tender. No cva tenderness. Spine non tender.    Medical Decision Making  Medically screening exam initiated at 10:43 AM.  Appropriate orders placed.  Cordella LITTIE Bessent was informed that the remainder of the evaluation will be completed by another provider, this initial triage assessment does not replace that evaluation, and the importance of remaining in the ED until their evaluation is complete.  Labs ordered.    Bernard Drivers, MD 01/31/24 1045

## 2024-01-31 NOTE — ED Provider Notes (Addendum)
 " [place] [facility] AT [facility] Provider Note   CSN: [id number] Arrival date & time: 01/31/24  1019     Patient presents with: Flank Pain   Robert Thompson is a 54 y.o. male.   Patient c/o right flank pain (mid to lower back posterio/laterally) in past couple days. Denies specific known injury or strain, no trauma/fall. Denies hx kidney stones, denies dysuria or hematuria. Pain is worse w certain movements and positional changes. No 'anterior' abd pain or pelvic pain. No scrotal or testicular pain or swelling. No radicular pain or leg pain. No chest pain or discomfort. No pleuritic pain. No sob or unusual doe. No new or worsening cough. No fever or chills. No skin changes or rash to area of pain.   The history is provided by the patient and medical records.  Flank Pain Pertinent negatives include no chest pain, no headaches and no shortness of breath.       Prior to Admission medications  Medication Sig Start Date End Date Taking? Authorizing Provider  amoxicillin -clavulanate (AUGMENTIN ) 875-125 MG tablet Take 1 tablet by mouth every 12 (twelve) hours. 02/02/15   Ward, Ami Copes, PA-C  methocarbamol  (ROBAXIN ) 500 MG tablet Take 1 tablet (500 mg total) by mouth 2 (two) times daily. 10/18/23   Donnajean Lynwood DEL, PA-C  oxyCODONE -acetaminophen  (PERCOCET/ROXICET) 5-325 MG tablet Take 1 tablet by mouth every 6 (six) hours as needed for severe pain. 02/02/15   Ward, Ami Copes, PA-C    Allergies: Patient has no known allergies.    Review of Systems  Constitutional:  Negative for fever.  HENT:  Negative for sore throat.   Respiratory:  Negative for cough and shortness of breath.   Cardiovascular:  Negative for chest pain.  Gastrointestinal:  Negative for diarrhea and vomiting.  Genitourinary:  Positive for flank pain. Negative for dysuria and hematuria.  Musculoskeletal:  Negative for back pain and neck pain.  Neurological:  Negative for weakness,  numbness and headaches.    Updated Vital Signs BP 130/77   Pulse 82   Temp 98.5 F (36.9 C)   Resp 17   Ht 1.676 m (5' 6)   Wt 79.4 kg   SpO2 100%   BMI 28.25 kg/m   Physical Exam Vitals and nursing note reviewed.  Constitutional:      Appearance: Normal appearance. He is well-developed.  HENT:     Head: Atraumatic.     Nose: Nose normal.     Mouth/Throat:     Mouth: Mucous membranes are moist.     Pharynx: Oropharynx is clear.  Eyes:     General: No scleral icterus.    Conjunctiva/sclera: Conjunctivae normal.  Neck:     Trachea: No tracheal deviation.  Cardiovascular:     Rate and Rhythm: Normal rate and regular rhythm.     Pulses: Normal pulses.     Heart sounds: Normal heart sounds. No murmur heard.    No friction rub. No gallop.  Pulmonary:     Effort: Pulmonary effort is normal. No accessory muscle usage or respiratory distress.     Breath sounds: Normal breath sounds.  Abdominal:     General: Bowel sounds are normal. There is no distension.     Palpations: Abdomen is soft. There is no mass.     Tenderness: There is no abdominal tenderness. There is no guarding.  Genitourinary:    Comments: No cva tenderness. Musculoskeletal:        General: No swelling.  Cervical back: Normal range of motion and neck supple. No rigidity.     Right lower leg: No edema.     Left lower leg: No edema.     Comments: T/L spine non tender, aligned. Musculoskeletal tenderness right mid to lower back laterally on the right. No sts to area. No skin lesions/rash.   Skin:    General: Skin is warm and dry.     Findings: No rash.  Neurological:     Mental Status: He is alert.     Comments: Alert, speech clear. Motor/sens grossly intact bil.   Psychiatric:        Mood and Affect: Mood normal.     (all labs ordered are listed, but only abnormal results are displayed) Results for orders placed or performed during the hospital encounter of 01/31/24  Basic metabolic panel    Collection Time: 01/31/24 10:34 AM  Result Value Ref Range   Sodium 135 135 - 145 mmol/L   Potassium 4.3 3.5 - 5.1 mmol/L   Chloride 102 98 - 111 mmol/L   CO2 19 (L) 22 - 32 mmol/L   Glucose, Bld 169 (H) 70 - 99 mg/dL   BUN 10 6 - 20 mg/dL   Creatinine, Ser 8.86 0.61 - 1.24 mg/dL   Calcium 8.7 (L) 8.9 - 10.3 mg/dL   GFR, Estimated >39 >39 mL/min   Anion gap 14 5 - 15  CBC   Collection Time: 01/31/24 10:34 AM  Result Value Ref Range   WBC 9.7 4.0 - 10.5 K/uL   RBC 5.21 4.22 - 5.81 MIL/uL   Hemoglobin 15.4 13.0 - 17.0 g/dL   HCT 55.1 60.9 - 47.9 %   MCV 86.0 80.0 - 100.0 fL   MCH 29.6 26.0 - 34.0 pg   MCHC 34.4 30.0 - 36.0 g/dL   RDW 85.3 88.4 - 84.4 %   Platelets 228 150 - 400 K/uL   nRBC 0.0 0.0 - 0.2 %  Urinalysis, Routine w reflex microscopic -Urine, Clean Catch   Collection Time: 01/31/24 10:39 AM  Result Value Ref Range   Color, Urine YELLOW YELLOW   APPearance HAZY (A) CLEAR   Specific Gravity, Urine 1.018 1.005 - 1.030   pH 5.0 5.0 - 8.0   Glucose, UA NEGATIVE NEGATIVE mg/dL   Hgb urine dipstick SMALL (A) NEGATIVE   Bilirubin Urine NEGATIVE NEGATIVE   Ketones, ur NEGATIVE NEGATIVE mg/dL   Protein, ur NEGATIVE NEGATIVE mg/dL   Nitrite NEGATIVE NEGATIVE   Leukocytes,Ua SMALL (A) NEGATIVE   RBC / HPF 0-5 0 - 5 RBC/hpf   WBC, UA 6-10 0 - 5 WBC/hpf   Bacteria, UA RARE (A) NONE SEEN   Squamous Epithelial / HPF 0-5 0 - 5 /HPF   Mucus PRESENT    DG Chest 2 View Result Date: 01/31/2024 CLINICAL DATA:  Right-sided flank pain/rib pain 2 days. Pleuritic pain. EXAM: CHEST - 2 VIEW COMPARISON:  08/26/2006 FINDINGS: Lungs are adequately inflated without focal airspace consolidation or effusion. No pneumothorax. Cardiomediastinal silhouette is normal. No evidence of rib fracture. Remaining bones and soft tissues are unremarkable. IMPRESSION: No active cardiopulmonary disease. Electronically Signed   By: Toribio Agreste M.D.   On: 01/31/2024 11:06     EKG: None  Radiology: DG  Chest 2 View Result Date: 01/31/2024 CLINICAL DATA:  Right-sided flank pain/rib pain 2 days. Pleuritic pain. EXAM: CHEST - 2 VIEW COMPARISON:  08/26/2006 FINDINGS: Lungs are adequately inflated without focal airspace consolidation or effusion. No pneumothorax. Cardiomediastinal silhouette  is normal. No evidence of rib fracture. Remaining bones and soft tissues are unremarkable. IMPRESSION: No active cardiopulmonary disease. Electronically Signed   By: Toribio Agreste M.D.   On: 01/31/2024 11:06     Procedures   Medications Ordered in the ED - No data to display  Clinical Course as of 01/31/24 1708  Thu Jan 31, 2024  1706 R pleuritic pain, needs dimer [LS]    Clinical Course User Index [LS] Rogelia Jerilynn RAMAN, MD                                 Medical Decision Making Problems Addressed: Right flank pain: acute illness or injury with systemic symptoms that poses a threat to life or bodily functions  Amount and/or Complexity of Data Reviewed External Data Reviewed: notes. Labs: ordered. Decision-making details documented in ED Course. Radiology: ordered and independent interpretation performed. Decision-making details documented in ED Course.  Risk OTC drugs. Prescription drug management. Decision regarding hospitalization.   Labs ordered/sent. Imaging ordered.   Differential diagnosis includes msk pain/strain, ureteral stone, etc. Dispo decision including potential need for admission considered - will get labs and imaging and reassess.   Reviewed nursing notes and prior charts for additional history. External reports reviewed.   Labs reviewed/interpreted by me - wbc and hgb normal. Ua w sm hgb, few wbc. Chem w mildly elevated glucose.   Xrays reviewed/interpreted by me - no pna or ptx.   No meds pta. Acetaminophen  po, ibuprofen po.   Given acute flank pain, will get ct r/o ureteral stone or acute process.  CT reviewed/interpreted by me - pnd.  Ct pending and d-dimer  pending, signed  out to oncoming EDP, Dr Rogelia that if ct neg for acute process and if ddimer normal, anticipate probable d/c to home then.         Final diagnoses:  Right flank pain    ED Discharge Orders     None          Bernard Drivers, MD 01/31/24 1708  "

## 2024-02-01 ENCOUNTER — Other Ambulatory Visit (HOSPITAL_COMMUNITY): Payer: Self-pay

## 2024-02-01 ENCOUNTER — Telehealth (HOSPITAL_COMMUNITY): Payer: Self-pay

## 2024-02-01 MED ORDER — APIXABAN (ELIQUIS) VTE STARTER PACK (10MG AND 5MG)
ORAL_TABLET | ORAL | 0 refills | Status: AC
  Filled 2024-02-01 (×2): qty 74, 30d supply, fill #0
  Filled 2024-02-01: qty 74, fill #0

## 2024-02-01 NOTE — Telephone Encounter (Signed)
 Pharmacy sent to does not have Eliquis .  Changed to [facility] pharmacy.

## 2024-02-04 ENCOUNTER — Telehealth: Payer: Self-pay | Admitting: *Deleted

## 2024-02-04 NOTE — Telephone Encounter (Signed)
 Pt called regarding taking Eliquis .  RNCM advised to take as prescribed.

## 2024-02-13 ENCOUNTER — Other Ambulatory Visit (HOSPITAL_COMMUNITY): Payer: Self-pay
